# Patient Record
Sex: Female | Born: 1975 | Race: Black or African American | Hispanic: No | Marital: Single | Smoking: Current every day smoker
Health system: Southern US, Community
[De-identification: ages and names within clinical notes are randomized; demographics above are authoritative.]

## PROBLEM LIST (undated history)

## (undated) DIAGNOSIS — D3500 Benign neoplasm of unspecified adrenal gland: Secondary | ICD-10-CM

## (undated) DIAGNOSIS — D219 Benign neoplasm of connective and other soft tissue, unspecified: Secondary | ICD-10-CM

## (undated) DIAGNOSIS — N83209 Unspecified ovarian cyst, unspecified side: Secondary | ICD-10-CM

## (undated) DIAGNOSIS — I708 Atherosclerosis of other arteries: Secondary | ICD-10-CM

## (undated) HISTORY — PX: APPENDECTOMY: SHX54

## (undated) HISTORY — PX: CHOLECYSTECTOMY: SHX55

---

## 2009-07-20 ENCOUNTER — Emergency Department (HOSPITAL_COMMUNITY): Admission: EM | Admit: 2009-07-20 | Discharge: 2009-07-20 | Payer: Self-pay | Admitting: Emergency Medicine

## 2009-08-19 ENCOUNTER — Emergency Department (HOSPITAL_COMMUNITY): Admission: EM | Admit: 2009-08-19 | Discharge: 2009-08-20 | Payer: Self-pay | Admitting: Emergency Medicine

## 2009-08-21 ENCOUNTER — Inpatient Hospital Stay (HOSPITAL_COMMUNITY): Admission: EM | Admit: 2009-08-21 | Discharge: 2009-08-24 | Payer: Self-pay | Admitting: Emergency Medicine

## 2009-12-15 ENCOUNTER — Emergency Department (HOSPITAL_COMMUNITY): Admission: EM | Admit: 2009-12-15 | Discharge: 2009-12-16 | Payer: Self-pay | Admitting: Emergency Medicine

## 2010-05-24 ENCOUNTER — Emergency Department (INDEPENDENT_AMBULATORY_CARE_PROVIDER_SITE_OTHER): Payer: Self-pay

## 2010-05-24 ENCOUNTER — Emergency Department (HOSPITAL_BASED_OUTPATIENT_CLINIC_OR_DEPARTMENT_OTHER)
Admission: EM | Admit: 2010-05-24 | Discharge: 2010-05-24 | Disposition: A | Discharge status: Home / Self Care | Payer: Self-pay | Attending: Emergency Medicine | Admitting: Emergency Medicine

## 2010-05-24 DIAGNOSIS — F17200 Nicotine dependence, unspecified, uncomplicated: Secondary | ICD-10-CM | POA: Insufficient documentation

## 2010-05-24 DIAGNOSIS — R109 Unspecified abdominal pain: Secondary | ICD-10-CM

## 2010-05-24 DIAGNOSIS — R1013 Epigastric pain: Secondary | ICD-10-CM | POA: Insufficient documentation

## 2010-05-24 DIAGNOSIS — R110 Nausea: Secondary | ICD-10-CM

## 2010-05-24 DIAGNOSIS — I10 Essential (primary) hypertension: Secondary | ICD-10-CM | POA: Insufficient documentation

## 2010-05-24 LAB — URINALYSIS, ROUTINE W REFLEX MICROSCOPIC
Hgb urine dipstick: NEGATIVE
Protein, ur: NEGATIVE mg/dL
Specific Gravity, Urine: 1.02 (ref 1.005–1.030)
Urine Glucose, Fasting: NEGATIVE mg/dL
pH: 7.5 (ref 5.0–8.0)

## 2010-05-24 LAB — COMPREHENSIVE METABOLIC PANEL
Calcium: 8.7 mg/dL (ref 8.4–10.5)
Chloride: 111 mEq/L (ref 96–112)
GFR calc Af Amer: >60 mL/min (ref >60)
GFR calc non Af Amer: >60 mL/min (ref >60)
Glucose, Bld: 86 mg/dL (ref 70–99)
Potassium: 3.4 mEq/L — ABNORMAL LOW (ref 3.5–5.1)

## 2010-05-24 LAB — DIFFERENTIAL
Eosinophils Absolute: 0.1 10*3/uL (ref 0.0–0.7)
Eosinophils Relative: 2 % (ref 0–5)
Lymphocytes Relative: 30 % (ref 12–46)
Lymphs Abs: 2.3 10*3/uL (ref 0.7–4.0)
Neutro Abs: 4.2 10*3/uL (ref 1.7–7.7)

## 2010-05-24 LAB — CBC
HCT: 36.1 % (ref 36.0–46.0)
Hemoglobin: 12.2 g/dL (ref 12.0–15.0)
MCH: 30.7 pg (ref 26.0–34.0)
MCHC: 33.8 g/dL (ref 30.0–36.0)
MCV: 90.9 fL (ref 78.0–100.0)
RDW: 14.2 % (ref 11.5–15.5)

## 2010-05-24 LAB — PREGNANCY, URINE: Preg Test, Ur: NEGATIVE

## 2010-06-16 LAB — DIFFERENTIAL
Basophils Absolute: 0 10*3/uL (ref 0.0–0.1)
Basophils Relative: 0 % (ref 0–1)
Monocytes Absolute: 0.5 10*3/uL (ref 0.1–1.0)
Monocytes Relative: 7 % (ref 3–12)
Neutro Abs: 3.7 10*3/uL (ref 1.7–7.7)
Neutrophils Relative %: 55 % (ref 43–77)

## 2010-06-16 LAB — POCT PREGNANCY, URINE: Preg Test, Ur: NEGATIVE

## 2010-06-16 LAB — COMPREHENSIVE METABOLIC PANEL
ALT: 18 U/L (ref 0–35)
Albumin: 4 g/dL (ref 3.5–5.2)
BUN: 8 mg/dL (ref 6–23)
Calcium: 9.3 mg/dL (ref 8.4–10.5)
Potassium: 3.8 mEq/L (ref 3.5–5.1)
Sodium: 137 mEq/L (ref 135–145)
Total Bilirubin: 0.4 mg/dL (ref 0.3–1.2)

## 2010-06-16 LAB — CBC
HCT: 42.5 % (ref 36.0–46.0)
Hemoglobin: 14.6 g/dL (ref 12.0–15.0)
MCH: 31.5 pg (ref 26.0–34.0)
MCHC: 34.4 g/dL (ref 30.0–36.0)
Platelets: 221 10*3/uL (ref 150–400)
RBC: 4.63 MIL/uL (ref 3.87–5.11)
RDW: 13.6 % (ref 11.5–15.5)

## 2010-06-16 LAB — URINALYSIS, ROUTINE W REFLEX MICROSCOPIC
Bilirubin Urine: NEGATIVE
Glucose, UA: NEGATIVE mg/dL
Protein, ur: NEGATIVE mg/dL
Specific Gravity, Urine: 1.021 (ref 1.005–1.030)
Urobilinogen, UA: 1 mg/dL (ref 0.0–1.0)
pH: 7 (ref 5.0–8.0)

## 2010-06-20 LAB — COMPREHENSIVE METABOLIC PANEL
Albumin: 3.3 g/dL — ABNORMAL LOW (ref 3.5–5.2)
BUN: 6 mg/dL (ref 6–23)
Creatinine, Ser: 0.52 mg/dL (ref 0.4–1.2)
Total Protein: 6.5 g/dL (ref 6.0–8.3)

## 2010-06-20 LAB — HEPATIC FUNCTION PANEL
AST: 37 U/L (ref 0–37)
Albumin: 4.4 g/dL (ref 3.5–5.2)
Alkaline Phosphatase: 65 U/L (ref 39–117)
Total Protein: 8 g/dL (ref 6.0–8.3)

## 2010-06-20 LAB — CBC
HCT: 33.2 % — ABNORMAL LOW (ref 36.0–46.0)
HCT: 36.8 % (ref 36.0–46.0)
Hemoglobin: 11.6 g/dL — ABNORMAL LOW (ref 12.0–15.0)
Hemoglobin: 12.5 g/dL (ref 12.0–15.0)
Hemoglobin: 14.5 g/dL (ref 12.0–15.0)
MCHC: 34.7 g/dL (ref 30.0–36.0)
MCHC: 34.7 g/dL (ref 30.0–36.0)
MCV: 94.1 fL (ref 78.0–100.0)
MCV: 94.7 fL (ref 78.0–100.0)
Platelets: 205 10*3/uL (ref 150–400)
RBC: 4.44 MIL/uL (ref 3.87–5.11)
RBC: 4.61 MIL/uL (ref 3.87–5.11)
RDW: 13.4 % (ref 11.5–15.5)
RDW: 13.7 % (ref 11.5–15.5)
WBC: 11.7 10*3/uL — ABNORMAL HIGH (ref 4.0–10.5)
WBC: 16.2 10*3/uL — ABNORMAL HIGH (ref 4.0–10.5)
WBC: 21.9 10*3/uL — ABNORMAL HIGH (ref 4.0–10.5)

## 2010-06-20 LAB — POCT I-STAT, CHEM 8
BUN: 11 mg/dL (ref 6–23)
Creatinine, Ser: 0.6 mg/dL (ref 0.4–1.2)
Glucose, Bld: 121 mg/dL — ABNORMAL HIGH (ref 70–99)
Potassium: 4.2 mEq/L (ref 3.5–5.1)
Sodium: 140 mEq/L (ref 135–145)
TCO2: 23 mmol/L (ref 0–100)

## 2010-06-20 LAB — BASIC METABOLIC PANEL
BUN: 6 mg/dL (ref 6–23)
CO2: 21 mEq/L (ref 19–32)
Calcium: 9.6 mg/dL (ref 8.4–10.5)
Creatinine, Ser: 0.59 mg/dL (ref 0.4–1.2)
GFR calc Af Amer: >60 mL/min (ref >60)
GFR calc non Af Amer: >60 mL/min (ref >60)
GFR calc non Af Amer: >60 mL/min (ref >60)
Glucose, Bld: 74 mg/dL (ref 70–99)
Potassium: 3.6 mEq/L (ref 3.5–5.1)
Sodium: 135 mEq/L (ref 135–145)

## 2010-06-20 LAB — RPR: RPR Ser Ql: NONREACTIVE

## 2010-06-20 LAB — URINE MICROSCOPIC-ADD ON

## 2010-06-20 LAB — BLOOD GAS, ARTERIAL
FIO2: 0.21 %
O2 Saturation: 96.4 %
pCO2 arterial: 39.1 mmHg (ref 35.0–45.0)
pO2, Arterial: 83.6 mmHg (ref 80.0–100.0)

## 2010-06-20 LAB — CULTURE, BLOOD (ROUTINE X 2)

## 2010-06-20 LAB — DIFFERENTIAL
Basophils Relative: 0 % (ref 0–1)
Basophils Relative: 0 % (ref 0–1)
Eosinophils Absolute: 0.1 10*3/uL (ref 0.0–0.7)
Lymphocytes Relative: 6 % — ABNORMAL LOW (ref 12–46)
Lymphs Abs: 1.2 10*3/uL (ref 0.7–4.0)
Lymphs Abs: 1.4 10*3/uL (ref 0.7–4.0)
Monocytes Absolute: 0.3 10*3/uL (ref 0.1–1.0)
Monocytes Relative: 2 % — ABNORMAL LOW (ref 3–12)
Monocytes Relative: 5 % (ref 3–12)
Neutro Abs: 19.6 10*3/uL — ABNORMAL HIGH (ref 1.7–7.7)
Neutrophils Relative %: 85 % — ABNORMAL HIGH (ref 43–77)
Neutrophils Relative %: 89 % — ABNORMAL HIGH (ref 43–77)

## 2010-06-20 LAB — URINALYSIS, ROUTINE W REFLEX MICROSCOPIC
Bilirubin Urine: NEGATIVE
Glucose, UA: NEGATIVE mg/dL
Glucose, UA: NEGATIVE mg/dL
Hgb urine dipstick: NEGATIVE
Ketones, ur: NEGATIVE mg/dL
Leukocytes, UA: NEGATIVE
Protein, ur: NEGATIVE mg/dL
Urobilinogen, UA: 1 mg/dL (ref 0.0–1.0)
pH: 5.5 (ref 5.0–8.0)

## 2010-06-20 LAB — WET PREP, GENITAL
Trich, Wet Prep: NONE SEEN
Yeast Wet Prep HPF POC: NONE SEEN

## 2010-06-20 LAB — ANA: Anti Nuclear Antibody(ANA): NEGATIVE

## 2010-06-20 LAB — RAPID URINE DRUG SCREEN, HOSP PERFORMED
Barbiturates: NOT DETECTED
Cocaine: NOT DETECTED
Opiates: NOT DETECTED
Tetrahydrocannabinol: POSITIVE — AB

## 2010-06-20 LAB — C-REACTIVE PROTEIN: CRP: 7.6 mg/dL — ABNORMAL HIGH (ref <0.6)

## 2010-06-20 LAB — PORPHYRINS, FRACTIONATED URINE (TIMED COLLECTION): Volume, Urine-PORPF: 2500

## 2010-06-20 LAB — AMINOLEVULINIC ACID, 24 HOUR: Total Volume - ALA: 1146 mL

## 2010-06-20 LAB — POCT PREGNANCY, URINE: Preg Test, Ur: NEGATIVE

## 2010-06-20 LAB — MONONUCLEOSIS SCREEN: Mono Screen: NEGATIVE

## 2010-06-20 LAB — TROPONIN I: Troponin I: <0.01 ng/mL (ref 0.00–0.06)

## 2010-06-20 LAB — CK TOTAL AND CKMB (NOT AT ARMC): CK, MB: 4.1 ng/mL — ABNORMAL HIGH (ref 0.3–4.0)

## 2010-06-21 LAB — WET PREP, GENITAL: Yeast Wet Prep HPF POC: NONE SEEN

## 2010-06-21 LAB — URINALYSIS, ROUTINE W REFLEX MICROSCOPIC
Nitrite: NEGATIVE
Protein, ur: NEGATIVE mg/dL
Specific Gravity, Urine: 1.024 (ref 1.005–1.030)
Urobilinogen, UA: 1 mg/dL (ref 0.0–1.0)

## 2010-06-21 LAB — DIFFERENTIAL
Basophils Relative: 0 % (ref 0–1)
Eosinophils Absolute: 0.1 10*3/uL (ref 0.0–0.7)
Lymphs Abs: 1.4 10*3/uL (ref 0.7–4.0)
Monocytes Relative: 1 % — ABNORMAL LOW (ref 3–12)
Neutro Abs: 9.2 10*3/uL — ABNORMAL HIGH (ref 1.7–7.7)
Neutrophils Relative %: 85 % — ABNORMAL HIGH (ref 43–77)

## 2010-06-21 LAB — CBC
MCHC: 34.9 g/dL (ref 30.0–36.0)
MCV: 95.7 fL (ref 78.0–100.0)
Platelets: 226 10*3/uL (ref 150–400)
RBC: 3.93 MIL/uL (ref 3.87–5.11)
WBC: 10.9 10*3/uL — ABNORMAL HIGH (ref 4.0–10.5)

## 2010-06-21 LAB — BASIC METABOLIC PANEL
BUN: 9 mg/dL (ref 6–23)
Calcium: 9.2 mg/dL (ref 8.4–10.5)
Chloride: 108 mEq/L (ref 96–112)
Creatinine, Ser: 0.77 mg/dL (ref 0.4–1.2)
GFR calc Af Amer: >60 mL/min (ref >60)

## 2010-06-21 LAB — GC/CHLAMYDIA PROBE AMP, GENITAL
Chlamydia, DNA Probe: NEGATIVE
GC Probe Amp, Genital: NEGATIVE

## 2010-06-21 LAB — POCT PREGNANCY, URINE: Preg Test, Ur: NEGATIVE

## 2010-10-27 ENCOUNTER — Emergency Department (HOSPITAL_COMMUNITY)
Admission: EM | Admit: 2010-10-27 | Discharge: 2010-10-27 | Disposition: A | Discharge status: Home / Self Care | Payer: Self-pay | Attending: Emergency Medicine | Admitting: Emergency Medicine

## 2010-10-27 DIAGNOSIS — N898 Other specified noninflammatory disorders of vagina: Secondary | ICD-10-CM | POA: Insufficient documentation

## 2010-10-27 DIAGNOSIS — R109 Unspecified abdominal pain: Secondary | ICD-10-CM | POA: Insufficient documentation

## 2010-10-27 DIAGNOSIS — I10 Essential (primary) hypertension: Secondary | ICD-10-CM | POA: Insufficient documentation

## 2010-10-27 LAB — WET PREP, GENITAL
Trich, Wet Prep: NONE SEEN
Yeast Wet Prep HPF POC: NONE SEEN

## 2010-10-27 LAB — POCT I-STAT, CHEM 8
Calcium, Ion: 1.11 mmol/L — ABNORMAL LOW (ref 1.12–1.32)
Chloride: 108 mEq/L (ref 96–112)
Creatinine, Ser: 0.6 mg/dL (ref 0.50–1.10)
Glucose, Bld: 73 mg/dL (ref 70–99)
Potassium: 4.3 mEq/L (ref 3.5–5.1)

## 2010-10-27 LAB — URINALYSIS, ROUTINE W REFLEX MICROSCOPIC
Bilirubin Urine: NEGATIVE
Glucose, UA: NEGATIVE mg/dL
Ketones, ur: 15 mg/dL — AB
Specific Gravity, Urine: 1.025 (ref 1.005–1.030)
pH: 6.5 (ref 5.0–8.0)

## 2010-10-27 LAB — URINE MICROSCOPIC-ADD ON

## 2011-06-15 IMAGING — CT CT ABD-PELV W/ CM
2 of 5 series · 17 of 46 positions shown, 19 images · IV contrast (APPLIED)
Comparison: 07/20/2009.

CLINICAL DATA: Vomiting and nausea.  Constipation.  Periumbilical
pain.  Fever.

CT ABDOMEN AND PELVIS WITH CONTRAST
TECHNIQUE: Multidetector CT imaging of the abdomen and pelvis was
performed following the standard protocol during bolus
administration of intravenous contrast.
Contrast: 80 ml Omnipaque 300

[Series 2: abd/pelv with 5.0 b31f st · axial · 0.60mm/px · z∈[+758,+1138]mm · 14 of 86 slices shown, 16 images]
[im 5/86  soft-tissue]
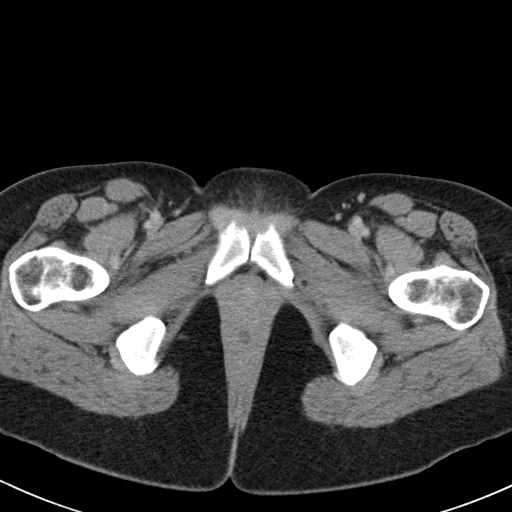
[im 5/86  bone]
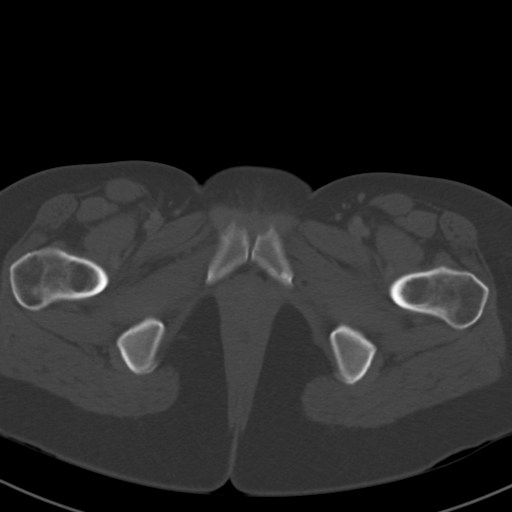
[im 13/86  soft-tissue]
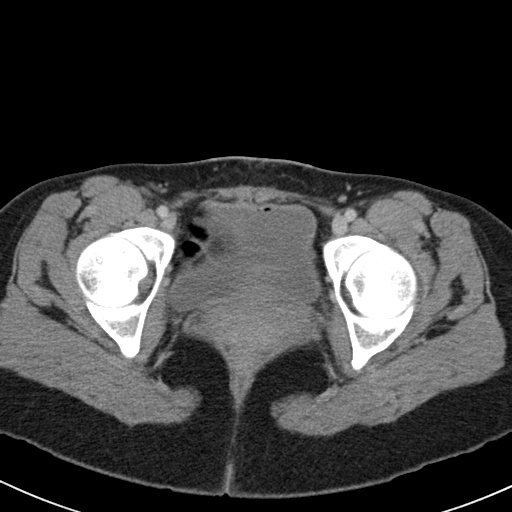
[im 18/86  soft-tissue]
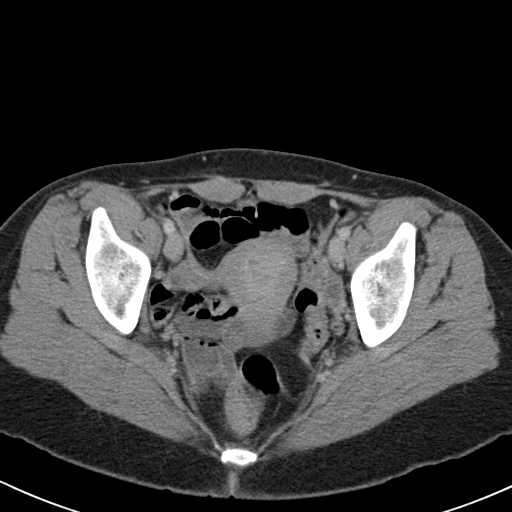
[im 22/86  soft-tissue]
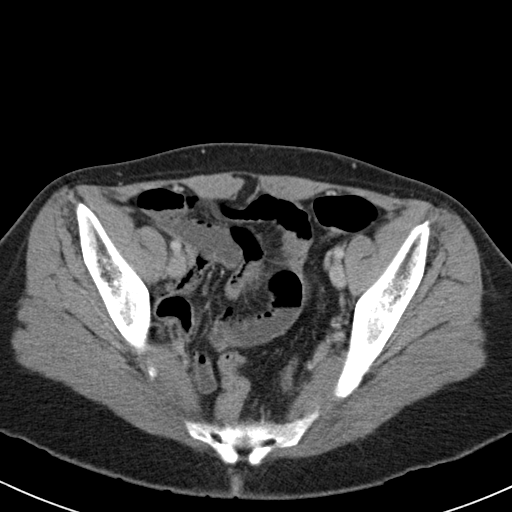
[im 30/86  soft-tissue]
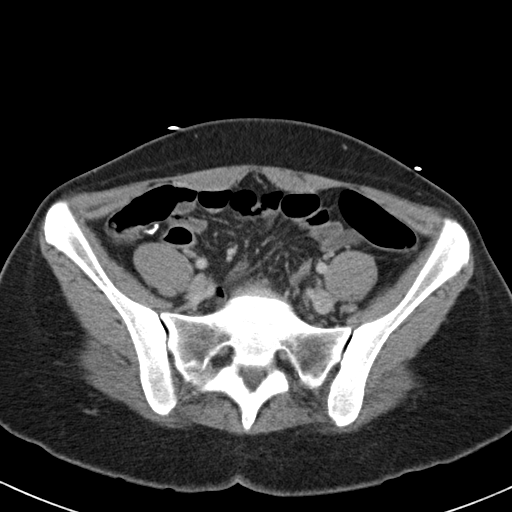
[im 35/86  soft-tissue]
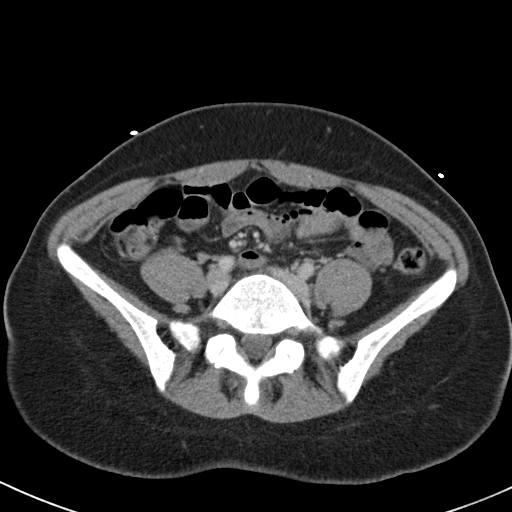
[im 39/86  soft-tissue]
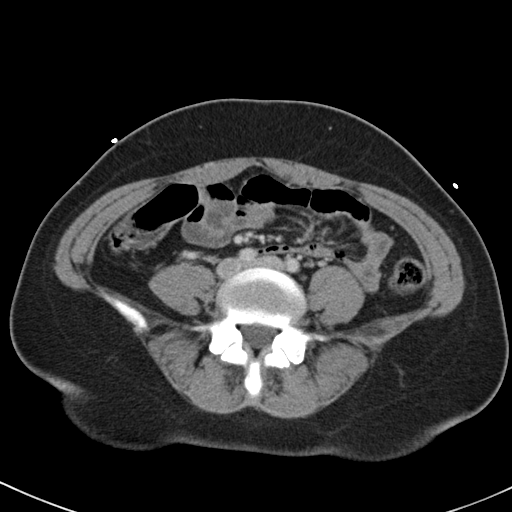
[im 47/86  soft-tissue]
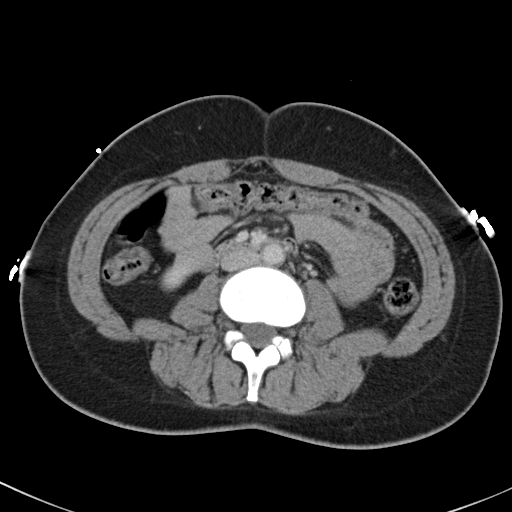
[im 52/86  soft-tissue]
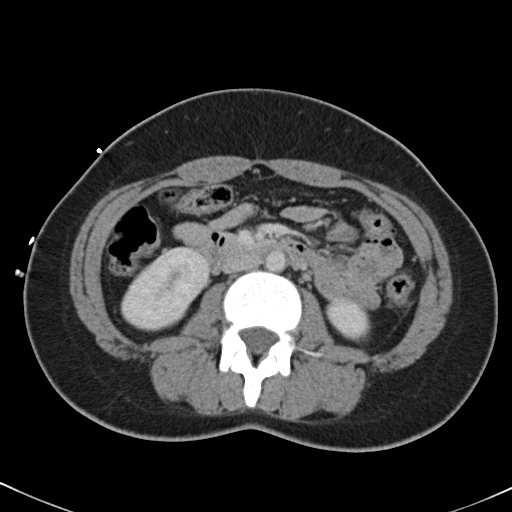
[im 52/86  bone]
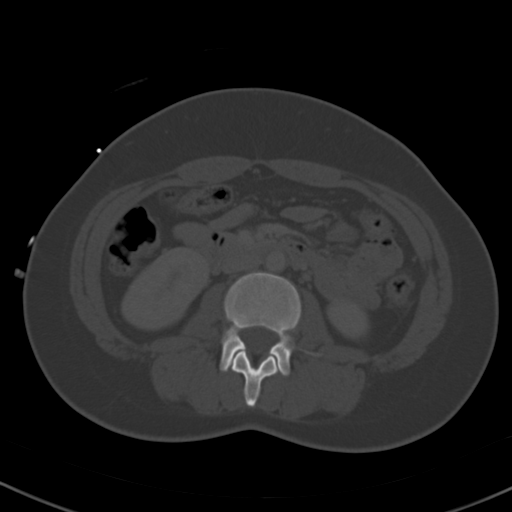
[im 56/86  soft-tissue]
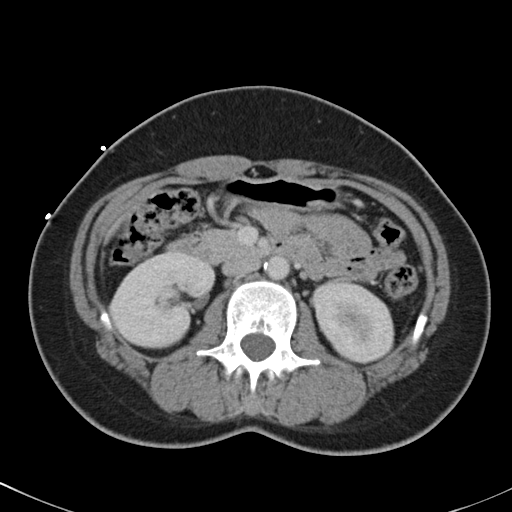
[im 64/86  soft-tissue]
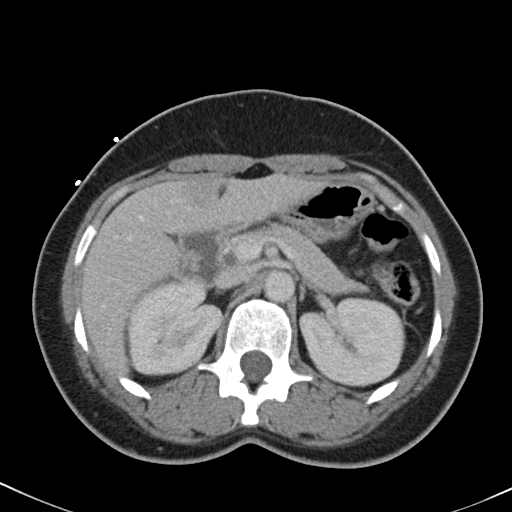
[im 69/86  soft-tissue]
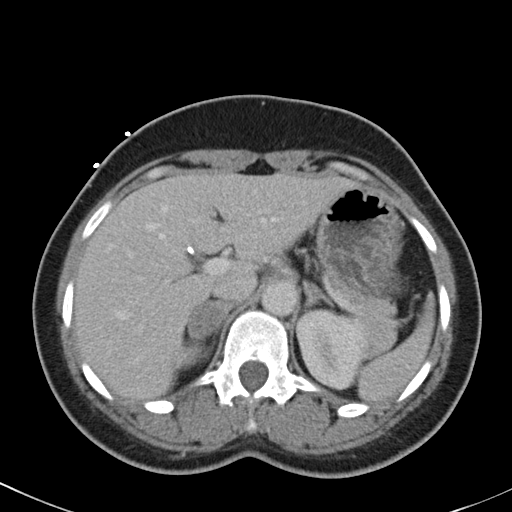
[im 73/86  soft-tissue]
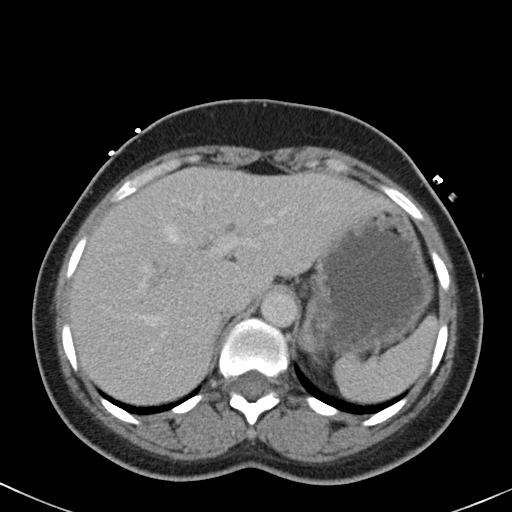
[im 81/86  soft-tissue]
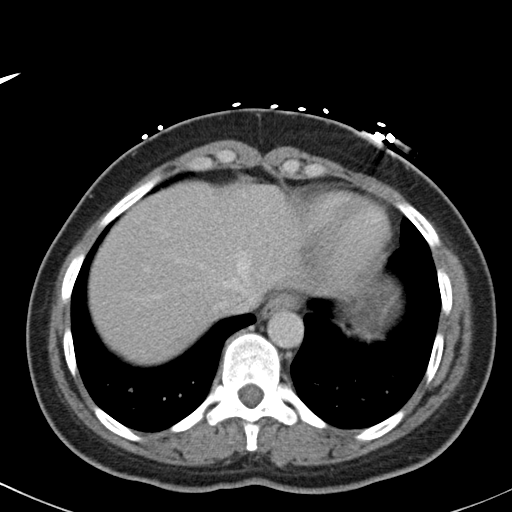

[Series 602: <mpr range> · coronal · 0.84mm/px · 3 of 91 slices shown]
[im 31/91  soft-tissue]
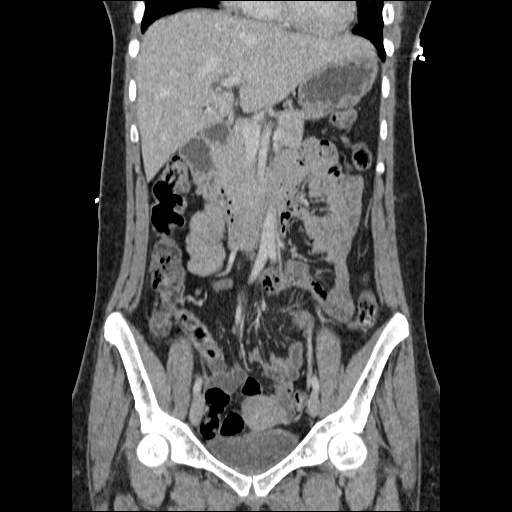
[im 41/91  soft-tissue]
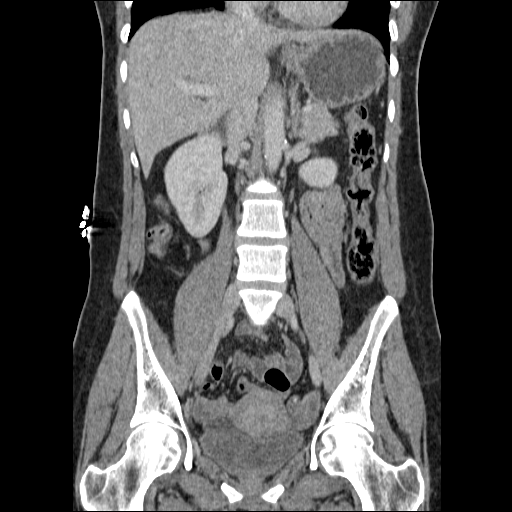
[im 51/91  soft-tissue]
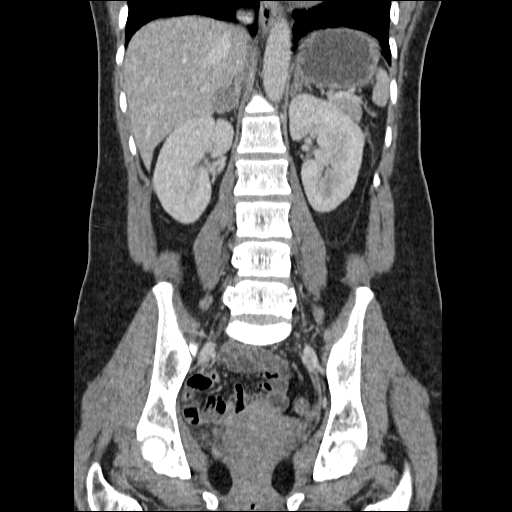

[17 of 46 positions shown; findings below may reference images not displayed]

FINDINGS: The lung bases are clear without focal nodule, mass, or
airspace disease.  The heart size is normal.  No significant
pleural or pericardial effusion is present.

The infused appearance of the liver and spleen is normal.  The
stomach, duodenum, and pancreas are within normal limits.  Mild
prominence of the common bile duct is within normal limits
following cholecystectomy.  A right adrenal adenoma is stable.  The
left adrenal gland is mildly thickened, but without focal mass.
The kidneys are within normal limits bilaterally.

The sigmoid colon is mostly collapsed.  The remainder of the colon
is within normal limits.  The distal small bowel is fluid-filled.
There are some air-fluid levels without significant distention of
bowel.  The appendix is surgically absent.  The uterus and adnexa
are normal for age.  No significant adenopathy or free fluid is
present.  Mild atherosclerotic calcifications are noted in the
aorta without aneurysm.

The bone windows are unremarkable.
IMPRESSION: 1.  Fluid levels within the distal small bowel without focal
dilation.  This may represent a nonspecific enteritis or ileus.
2.  Status post cholecystectomy and appendectomy.
3.  Stable right adrenal adenoma.

## 2011-08-18 ENCOUNTER — Emergency Department (HOSPITAL_COMMUNITY): Payer: Self-pay

## 2011-08-18 ENCOUNTER — Emergency Department (HOSPITAL_COMMUNITY)
Admission: EM | Admit: 2011-08-18 | Discharge: 2011-08-18 | Disposition: A | Discharge status: Home Health | Payer: Self-pay | Attending: Emergency Medicine | Admitting: Emergency Medicine

## 2011-08-18 ENCOUNTER — Encounter (HOSPITAL_COMMUNITY): Payer: Self-pay

## 2011-08-18 DIAGNOSIS — R112 Nausea with vomiting, unspecified: Secondary | ICD-10-CM | POA: Insufficient documentation

## 2011-08-18 DIAGNOSIS — I708 Atherosclerosis of other arteries: Secondary | ICD-10-CM

## 2011-08-18 DIAGNOSIS — R109 Unspecified abdominal pain: Secondary | ICD-10-CM | POA: Insufficient documentation

## 2011-08-18 DIAGNOSIS — I7090 Unspecified atherosclerosis: Secondary | ICD-10-CM

## 2011-08-18 DIAGNOSIS — R3915 Urgency of urination: Secondary | ICD-10-CM | POA: Insufficient documentation

## 2011-08-18 HISTORY — DX: Atherosclerosis of other arteries: I70.8

## 2011-08-18 HISTORY — DX: Benign neoplasm of unspecified adrenal gland: D35.00

## 2011-08-18 HISTORY — DX: Unspecified atherosclerosis: I70.90

## 2011-08-18 LAB — URINALYSIS, ROUTINE W REFLEX MICROSCOPIC
Nitrite: NEGATIVE
Protein, ur: NEGATIVE mg/dL
Specific Gravity, Urine: 1.025 (ref 1.005–1.030)
Urobilinogen, UA: 1 mg/dL (ref 0.0–1.0)

## 2011-08-18 LAB — POCT PREGNANCY, URINE: Preg Test, Ur: NEGATIVE

## 2011-08-18 LAB — URINE MICROSCOPIC-ADD ON

## 2011-08-18 MED ORDER — ONDANSETRON HCL 4 MG PO TABS: 4.0000 mg | ORAL_TABLET | Freq: Four times a day (QID) | ORAL | Status: AC

## 2011-08-18 MED ORDER — ONDANSETRON 4 MG PO TBDP
8.0000 mg | ORAL_TABLET | Freq: Once | ORAL | Status: AC
  Administered 2011-08-18: 8 mg via ORAL
  Filled 2011-08-18: qty 2

## 2011-08-18 MED ORDER — OXYCODONE-ACETAMINOPHEN 5-325 MG PO TABS: 1.0000 | ORAL_TABLET | Freq: Four times a day (QID) | ORAL | Status: AC | PRN

## 2011-08-18 MED ORDER — HYDROMORPHONE HCL PF 2 MG/ML IJ SOLN
2.0000 mg | Freq: Once | INTRAMUSCULAR | Status: AC
  Administered 2011-08-18: 2 mg via INTRAMUSCULAR
  Filled 2011-08-18: qty 1

## 2011-08-18 MED ORDER — OXYCODONE-ACETAMINOPHEN 5-325 MG PO TABS
1.0000 | ORAL_TABLET | Freq: Once | ORAL | Status: AC
  Administered 2011-08-18: 1 via ORAL
  Filled 2011-08-18: qty 1

## 2011-08-18 NOTE — ED Provider Notes (Signed)
History  This chart was scribed for Allison Melter, MD by Bennett Scrape. This patient was seen in room STRE7/STRE7 and the patient's care was started at 3:29PM.  CSN: [id number]  Arrival date & time 08/18/11  1410   First MD Initiated Contact with Patient 08/18/11 1529      Chief Complaint  Patient presents with  . Flank Pain    The history is provided by the patient. No language interpreter was used.    Allison Hebert is a 36 y.o. female who presents to the Emergency Department complaining of 3 days of transient right flank pain that radiates to the RUQ with associated nausea and emesis. She also c/o urinary urgency.  Pt states that she is here for evaluation today, because the pain has become constant today. The pain is improved with laying down and worsened with movement. She has not taken any medications to improve her pain. She denies fever, sore throat, diarrhea, hematuria and SOB as associated symptoms. She denies having a h/o kidney stones. She has no h/o chronic medical conditions. She is a current everyday smoker and alcohol user   Past Medical History  Diagnosis Date  . Adrenal adenoma     Unchanged 08/18/11  . Atherosclerosis of arteries 08/18/11    Past Surgical History  Procedure Date  . Cholecystectomy   . Appendectomy     History reviewed. No pertinent family history.  History  Substance Use Topics  . Smoking status: Current Everyday Smoker  . Smokeless tobacco: Not on file  . Alcohol Use: Yes    Review of Systems  A complete 10 system review of systems was obtained and all systems are negative except as noted in the HPI and PMH.   Allergies  Review of patient's allergies indicates no known allergies.  Home Medications   Current Outpatient Rx  Name Route Sig Dispense Refill  . ONDANSETRON HCL 4 MG PO TABS Oral Take 1 tablet (4 mg total) by mouth every 6 (six) hours. 12 tablet 0  . OXYCODONE-ACETAMINOPHEN 5-325 MG PO TABS Oral Take 1 tablet by  mouth every 6 (six) hours as needed for pain. 15 tablet 0    Triage Vitals: BP 146/93  Pulse 118  Temp(Src) 97.9 F (36.6 C) (Oral)  Resp 22  SpO2 100%  Physical Exam  Nursing note and vitals reviewed. Constitutional: She is oriented to person, place, and time. She appears well-developed and well-nourished.  HENT:  Head: Normocephalic and atraumatic.  Eyes: Conjunctivae and EOM are normal. Pupils are equal, round, and reactive to light.  Neck: Normal range of motion and phonation normal. Neck supple.  Cardiovascular: Normal rate, regular rhythm and intact distal pulses.   Pulmonary/Chest: Effort normal and breath sounds normal. She exhibits no tenderness.  Abdominal: Soft. She exhibits no distension. There is tenderness. There is guarding.       Tenderness and guarding in RUQ  Musculoskeletal: Normal range of motion.       Tender to light touch in right flank  Neurological: She is alert and oriented to person, place, and time. She has normal strength. She exhibits normal muscle tone.  Skin: Skin is warm and dry.  Psychiatric: She has a normal mood and affect. Her behavior is normal. Judgment and thought content normal.    ED Course  Procedures (including critical care time)  DIAGNOSTIC STUDIES: Oxygen Saturation is 100% on room air, normal by my interpretation.    COORDINATION OF CARE: 4:07PM-Discussed   Labs Reviewed  URINALYSIS, ROUTINE W REFLEX MICROSCOPIC - Abnormal; Notable for the following:    APPearance CLOUDY (*)    Ketones, ur 40 (*)    Leukocytes, UA SMALL (*)    All other components within normal limits  URINE MICROSCOPIC-ADD ON - Abnormal; Notable for the following:    Squamous Epithelial / LPF MANY (*)    Bacteria, UA FEW (*)    All other components within normal limits  URINE CULTURE  POCT PREGNANCY, URINE  LAB REPORT - SCANNED   Ct Abdomen Pelvis Wo Contrast  08/18/2011  *RADIOLOGY REPORT*  Clinical Data: Flank pain. Right lower quadrant abdominal  pain. Nausea and vomiting.  CT ABDOMEN AND PELVIS WITHOUT CONTRAST  Technique:  Multidetector CT imaging of the abdomen and pelvis was performed following the standard protocol without intravenous contrast.  Comparison: CT of the abdomen and pelvis 05/24/2010.  Findings:  Lung Bases: Unremarkable.  Abdomen/Pelvis:  There are no abnormal calcifications within the collecting system of either kidney, along the course of either ureter, or within the lumen of the urinary bladder.  No hydroureteronephrosis or perinephric stranding at this time.  The patient is status post cholecystectomy.  The unenhanced appearance of the liver, spleen, pancreas, left adrenal gland and bilateral kidneys is unremarkable.  There is a 2.8 x 1.9 cm low attenuation (2 HU) lesion in the right adrenal gland which is similar to the prior study, compatible with an adrenal adenoma.  Postoperative changes of appendectomy are noted.  There is a trace volume of free fluid in the cul-de-sac, presumably physiologic in this young female patient.  No larger volume of ascites or pneumoperitoneum is identified.  The uterus, bilateral ovaries and urinary bladder are unremarkable on this noncontrast CT examination.  No pathologic distension of bowel.  No definite pathologic adenopathy identified within the abdomen and pelvis on today's noncontrast CT examination. Extensive atherosclerosis is noted throughout the abdominal and pelvic vasculature, without definite aneurysm.  Musculoskeletal: There are no aggressive appearing lytic or blastic lesions noted in the visualized portions of the skeleton.  IMPRESSION: 1.  No evidence of abnormal urinary tract calcifications. 2.  Status post cholecystectomy and appendectomy. 3.  Small volume of free fluid in the cul-de-sac is presumably physiologic in this young female patient. 4.  Atherosclerosis.  Original Report Authenticated By: Florencia Reasons, M.D.     1. Abdominal pain       MDM  Nonspecific pain,  moderate, requiring further evaluation.    Sent to Holding area for CT A/P. Discussed with Ms. Neva Seat   I personally performed the services described in this documentation, which was scribed in my presence. The recorded information has been reviewed and considered.     Allison Melter, MD 08/20/11 [phone]

## 2011-08-18 NOTE — Discharge Instructions (Signed)

## 2011-08-18 NOTE — ED Provider Notes (Signed)
Patient to ED for 3 days of abdominal pain which was much worse today. She was originally seen by Dr. Effie Shy and placed in the CDU to wait for CT abd/pelv and labs.  Results for orders placed during the hospital encounter of 08/18/11  URINALYSIS, ROUTINE W REFLEX MICROSCOPIC      Component Value Range   Color, Urine YELLOW  YELLOW    APPearance CLOUDY (*) CLEAR    Specific Gravity, Urine 1.025  1.005 - 1.030    pH 6.0  5.0 - 8.0    Glucose, UA NEGATIVE  NEGATIVE (mg/dL)   Hgb urine dipstick NEGATIVE  NEGATIVE    Bilirubin Urine NEGATIVE  NEGATIVE    Ketones, ur 40 (*) NEGATIVE (mg/dL)   Protein, ur NEGATIVE  NEGATIVE (mg/dL)   Urobilinogen, UA 1.0  0.0 - 1.0 (mg/dL)   Nitrite NEGATIVE  NEGATIVE    Leukocytes, UA SMALL (*) NEGATIVE   POCT PREGNANCY, URINE      Component Value Range   Preg Test, Ur NEGATIVE  NEGATIVE   URINE MICROSCOPIC-ADD ON      Component Value Range   Squamous Epithelial / LPF MANY (*) RARE    WBC, UA 3-6  <3 (WBC/hpf)   Bacteria, UA FEW (*) RARE    Urine-Other MUCOUS PRESENT     Ct Abdomen Pelvis Wo Contrast  08/18/2011  *RADIOLOGY REPORT*  Clinical Data: Flank pain. Right lower quadrant abdominal pain. Nausea and vomiting.  CT ABDOMEN AND PELVIS WITHOUT CONTRAST  Technique:  Multidetector CT imaging of the abdomen and pelvis was performed following the standard protocol without intravenous contrast.  Comparison: CT of the abdomen and pelvis 05/24/2010.  Findings:  Lung Bases: Unremarkable.  Abdomen/Pelvis:  There are no abnormal calcifications within the collecting system of either kidney, along the course of either ureter, or within the lumen of the urinary bladder.  No hydroureteronephrosis or perinephric stranding at this time.  The patient is status post cholecystectomy.  The unenhanced appearance of the liver, spleen, pancreas, left adrenal gland and bilateral kidneys is unremarkable.  There is a 2.8 x 1.9 cm low attenuation (2 HU) lesion in the right adrenal  gland which is similar to the prior study, compatible with an adrenal adenoma.  Postoperative changes of appendectomy are noted.  There is a trace volume of free fluid in the cul-de-sac, presumably physiologic in this young female patient.  No larger volume of ascites or pneumoperitoneum is identified.  The uterus, bilateral ovaries and urinary bladder are unremarkable on this noncontrast CT examination.  No pathologic distension of bowel.  No definite pathologic adenopathy identified within the abdomen and pelvis on today's noncontrast CT examination. Extensive atherosclerosis is noted throughout the abdominal and pelvic vasculature, without definite aneurysm.  Musculoskeletal: There are no aggressive appearing lytic or blastic lesions noted in the visualized portions of the skeleton.  IMPRESSION: 1.  No evidence of abnormal urinary tract calcifications. 2.  Status post cholecystectomy and appendectomy. 3.  Small volume of free fluid in the cul-de-sac is presumably physiologic in this young female patient. 4.  Atherosclerosis.  Original Report Authenticated By: Florencia Reasons, M.D.      CT abd/pelv shows no abnormalities. Her urinalysis showed some bacteria but had many epithelial cells in it. I have re-evaluated patient and she is feeling much better. My plan is to discharge her with pain and nausea medication as well as a follow-up to GI as she does not have a PCP. Patient is comfortable and agreeable  with plan.  Pt has been advised of the symptoms that warrant their return to the ED. Patient has voiced understanding and has agreed to follow-up with the PCP or specialist.   Dorthula Matas, PA 08/18/11 [phone]

## 2011-08-18 NOTE — ED Notes (Signed)
Pt complains of back pain sts her kidney is hurting really bad.

## 2011-08-19 LAB — URINE CULTURE: Colony Count: 35000

## 2011-08-20 ENCOUNTER — Encounter (HOSPITAL_COMMUNITY): Payer: Self-pay | Admitting: Emergency Medicine

## 2011-08-20 DIAGNOSIS — D3500 Benign neoplasm of unspecified adrenal gland: Secondary | ICD-10-CM | POA: Insufficient documentation

## 2011-08-20 DIAGNOSIS — I708 Atherosclerosis of other arteries: Secondary | ICD-10-CM | POA: Insufficient documentation

## 2011-08-20 NOTE — ED Provider Notes (Signed)
Medical screening examination/treatment/procedure(s) were conducted as a shared visit with non-physician practitioner(s) and myself.  I personally evaluated the patient during the encounter  Flint Melter, MD 08/20/11 [phone]

## 2012-02-08 ENCOUNTER — Emergency Department (HOSPITAL_BASED_OUTPATIENT_CLINIC_OR_DEPARTMENT_OTHER)
Admission: EM | Admit: 2012-02-08 | Discharge: 2012-02-08 | Disposition: A | Discharge status: Home / Self Care | Payer: Self-pay | Attending: Emergency Medicine | Admitting: Emergency Medicine

## 2012-02-08 ENCOUNTER — Emergency Department (HOSPITAL_BASED_OUTPATIENT_CLINIC_OR_DEPARTMENT_OTHER): Payer: Self-pay

## 2012-02-08 ENCOUNTER — Encounter (HOSPITAL_BASED_OUTPATIENT_CLINIC_OR_DEPARTMENT_OTHER): Payer: Self-pay | Admitting: *Deleted

## 2012-02-08 DIAGNOSIS — D259 Leiomyoma of uterus, unspecified: Secondary | ICD-10-CM | POA: Insufficient documentation

## 2012-02-08 DIAGNOSIS — Z9089 Acquired absence of other organs: Secondary | ICD-10-CM | POA: Insufficient documentation

## 2012-02-08 DIAGNOSIS — R109 Unspecified abdominal pain: Secondary | ICD-10-CM | POA: Insufficient documentation

## 2012-02-08 DIAGNOSIS — Z3202 Encounter for pregnancy test, result negative: Secondary | ICD-10-CM | POA: Insufficient documentation

## 2012-02-08 DIAGNOSIS — F17200 Nicotine dependence, unspecified, uncomplicated: Secondary | ICD-10-CM | POA: Insufficient documentation

## 2012-02-08 LAB — CBC WITH DIFFERENTIAL/PLATELET
Basophils Absolute: 0 10*3/uL (ref 0.0–0.1)
Basophils Relative: 1 % (ref 0–1)
Eosinophils Absolute: 0.2 10*3/uL (ref 0.0–0.7)
Eosinophils Relative: 2 % (ref 0–5)
HCT: 39.1 % (ref 36.0–46.0)
Lymphocytes Relative: 18 % (ref 12–46)
MCHC: 34.3 g/dL (ref 30.0–36.0)
MCV: 94.4 fL (ref 78.0–100.0)
Monocytes Absolute: 0.8 10*3/uL (ref 0.1–1.0)
Platelets: 192 10*3/uL (ref 150–400)
RDW: 13.9 % (ref 11.5–15.5)
WBC: 8.2 10*3/uL (ref 4.0–10.5)

## 2012-02-08 LAB — URINALYSIS, ROUTINE W REFLEX MICROSCOPIC
Bilirubin Urine: NEGATIVE
Glucose, UA: NEGATIVE mg/dL
Ketones, ur: NEGATIVE mg/dL
Nitrite: NEGATIVE
Specific Gravity, Urine: 1.028 (ref 1.005–1.030)
pH: 5.5 (ref 5.0–8.0)

## 2012-02-08 LAB — URINE MICROSCOPIC-ADD ON

## 2012-02-08 MED ORDER — HYDROMORPHONE HCL PF 1 MG/ML IJ SOLN
1.0000 mg | Freq: Once | INTRAMUSCULAR | Status: AC
  Administered 2012-02-08: 1 mg via INTRAVENOUS
  Filled 2012-02-08: qty 1

## 2012-02-08 MED ORDER — SODIUM CHLORIDE 0.9 % IV SOLN: Freq: Once | INTRAVENOUS | Status: DC

## 2012-02-08 MED ORDER — SODIUM CHLORIDE 0.9 % IV BOLUS (SEPSIS): 1000.0000 mL | Freq: Once | INTRAVENOUS | Status: DC

## 2012-02-08 MED ORDER — HYDROCODONE-ACETAMINOPHEN 5-325 MG PO TABS: 1.0000 | ORAL_TABLET | ORAL | Status: AC | PRN

## 2012-02-08 MED ORDER — IOHEXOL 300 MG/ML  SOLN: 25.0000 mL | INTRAMUSCULAR | Status: DC

## 2012-02-08 MED ORDER — ONDANSETRON HCL 4 MG/2ML IJ SOLN
4.0000 mg | Freq: Once | INTRAMUSCULAR | Status: AC
  Administered 2012-02-08: 4 mg via INTRAVENOUS
  Filled 2012-02-08: qty 2

## 2012-02-08 NOTE — ED Notes (Signed)
Family at bedside. 

## 2012-02-08 NOTE — ED Notes (Signed)
Patient is resting comfortably. 

## 2012-02-08 NOTE — ED Notes (Signed)
Unsuccessful IV attempt in left arm x 3.

## 2012-02-08 NOTE — ED Notes (Signed)
Pt. Has had several staff member to attempt IV sites unsuccessfully.. Pt. Has a R wrist IV she dislodged and pulled out.

## 2012-02-08 NOTE — ED Notes (Signed)
Vital signs stable. 

## 2012-02-08 NOTE — ED Notes (Signed)
MD at bedside. 

## 2012-02-08 NOTE — ED Provider Notes (Signed)
History     CSN: [id number]  Arrival date & time 02/08/12  1412   First MD Initiated Contact with Patient 02/08/12 1436      Chief Complaint  Patient presents with  . Abdominal Pain    (Consider location/radiation/quality/duration/timing/severity/associated sxs/prior treatment) Patient is a 36 y.o. female presenting with abdominal pain. The history is provided by the patient. The history is limited by the condition of the patient (severe pain).  Abdominal Pain The primary symptoms of the illness include abdominal pain.  She has been having generalized abdominal pain for the last week. There's been nausea and vomiting. She's not had any fever or chills but has broken out in sweats. Nothing makes the pain better nothing makes it worse. Pain is severe and rated at 10/10. She denies difficulty urinating he denies constipation or diarrhea. She is status post tubal ligation.  Past Medical History  Diagnosis Date  . Adrenal adenoma     Unchanged 08/18/11  . Atherosclerosis of arteries 08/18/11    Past Surgical History  Procedure Date  . Cholecystectomy   . Appendectomy     No family history on file.  History  Substance Use Topics  . Smoking status: Current Every Day Smoker -- 0.5 packs/day    Types: Cigarettes  . Smokeless tobacco: Not on file  . Alcohol Use: Yes    OB History    Grav Para Term Preterm Abortions TAB SAB Ect Mult Living                  Review of Systems  Unable to perform ROS: Other  Gastrointestinal: Positive for abdominal pain.  She is in severe pain and does not answer most questions, therefore detailed review of systems is not able to be obtained.  Allergies  Review of patient's allergies indicates no known allergies.  Home Medications  No current outpatient prescriptions on file.  BP 155/113  Pulse 118  Temp 99 F (37.2 C) (Oral)  Resp 18  Wt 122 lb (55.339 kg)  SpO2 98%  LMP 01/30/2012  Physical Exam  Nursing note and vitals  reviewed. 36 year old female who is diaphoretic and writhing in pain. Vital signs are significant for tachycardia with heart rate of 118, and hypertension with blood pressure 155/113. Oxygen saturation is 98%, which is normal. Head is normocephalic and atraumatic. PERRLA, EOMI. Oropharynx is clear. Neck is nontender and supple without adenopathy or JVD. Back is nontender. Lungs are clear without rales, wheezes, or rhonchi. Chest is nontender. Heart has regular rate and rhythm without murmur. Abdominal exam is difficult because the patient writhing in pain. However, she seems to have reasonably well localized right CVA tenderness without left CVA tenderness. She tenses her abdominal muscles as she is writhing on the bed, but the abdomen seems to be soft and without significant tenderness. There no masses palpable. Extremities have no cyanosis or edema, full range of motion is present. Skin is warm and diaphoretic without rash. Neurologic: Mental status is normal, cranial nerves are intact, there are no motor or sensory deficits.   ED Course  Procedures (including critical care time)  Results for orders placed during the hospital encounter of 02/08/12  URINALYSIS, ROUTINE W REFLEX MICROSCOPIC      Component Value Range   Color, Urine YELLOW  YELLOW   APPearance CLOUDY (*) CLEAR   Specific Gravity, Urine 1.028  1.005 - 1.030   pH 5.5  5.0 - 8.0   Glucose, UA NEGATIVE  NEGATIVE mg/dL  Hgb urine dipstick NEGATIVE  NEGATIVE   Bilirubin Urine NEGATIVE  NEGATIVE   Ketones, ur NEGATIVE  NEGATIVE mg/dL   Protein, ur NEGATIVE  NEGATIVE mg/dL   Urobilinogen, UA 1.0  0.0 - 1.0 mg/dL   Nitrite NEGATIVE  NEGATIVE   Leukocytes, UA SMALL (*) NEGATIVE  PREGNANCY, URINE      Component Value Range   Preg Test, Ur NEGATIVE  NEGATIVE  URINE MICROSCOPIC-ADD ON      Component Value Range   Squamous Epithelial / LPF RARE  RARE   WBC, UA 0-2  <3 WBC/hpf   RBC / HPF 3-6  <3 RBC/hpf   Bacteria, UA FEW  (*) RARE   Urine-Other MUCOUS PRESENT    CBC WITH DIFFERENTIAL      Component Value Range   WBC 8.2  4.0 - 10.5 K/uL   RBC 4.14  3.87 - 5.11 MIL/uL   Hemoglobin 13.4  12.0 - 15.0 g/dL   HCT 40.9  81.1 - 91.4 %   MCV 94.4  78.0 - 100.0 fL   MCH 32.4  26.0 - 34.0 pg   MCHC 34.3  30.0 - 36.0 g/dL   RDW 78.2  95.6 - 21.3 %   Platelets 192  150 - 400 K/uL   Neutrophils Relative 70  43 - 77 %   Neutro Abs 5.7  1.7 - 7.7 K/uL   Lymphocytes Relative 18  12 - 46 %   Lymphs Abs 1.5  0.7 - 4.0 K/uL   Monocytes Relative 10  3 - 12 %   Monocytes Absolute 0.8  0.1 - 1.0 K/uL   Eosinophils Relative 2  0 - 5 %   Eosinophils Absolute 0.2  0.0 - 0.7 K/uL   Basophils Relative 1  0 - 1 %   Basophils Absolute 0.0  0.0 - 0.1 K/uL     No diagnosis found.    MDM  Pain which seems to be concentrated in the right flank. I reviewed her old records and prior CT scans and she had a CT scan done in May showing no evidence of nephrolithiasis. Pelvic ultrasound in 2010 showed an 11x11x13 mm uterine fibroid. Therefore, kidney stone is felt to be very unlikely. Imaging will be obtained and CT scan with contrast and she will be treated symptomatically with IV fluids, IV hydromorphone, IV and ondansetron.  She feels much better after IV fluids and IV analgesics and antiemetics. She is no longer diaphoretic. CT scan is pending. Case is signed out to Dr. Ignacia Palma.      Dione Booze, MD 02/09/12 [phone]

## 2012-02-08 NOTE — Progress Notes (Signed)
5:12 PM Pt has poor IV access, so CT of abdomen and pelvis ordered as non-contrast study.  She continues to complain of pain, so remedicated with Dilaudid and Zofran.  6:38 PM Lab tests and CT of abdomen without contrast are negative.  I reviewed these results with pt.  In talking to her about her pain, she related that about a year ago she was told she had uterine fibroids.  She gets her pain either before or after her periods.  Her last period ended on November 4.  She denies having a discharge.  I offered to do pelvic exam which she declined.  I advised her to have followup with a gynecologist.  She can go to [facility], where they can do her pelvic exam and ultrasound, and advise her about uterine fibroids.

## 2012-02-08 NOTE — ED Notes (Signed)
Abdominal pain x 1 week. Diaphoretic at triage. Dusky skin color. States she was told a year ago she had a fibroid tumor that she never had follow up for.

## 2012-08-14 ENCOUNTER — Emergency Department (HOSPITAL_BASED_OUTPATIENT_CLINIC_OR_DEPARTMENT_OTHER)
Admission: EM | Admit: 2012-08-14 | Discharge: 2012-08-14 | Disposition: A | Discharge status: Home / Self Care | Payer: Self-pay | Attending: Emergency Medicine | Admitting: Emergency Medicine

## 2012-08-14 ENCOUNTER — Encounter (HOSPITAL_BASED_OUTPATIENT_CLINIC_OR_DEPARTMENT_OTHER): Payer: Self-pay | Admitting: *Deleted

## 2012-08-14 DIAGNOSIS — Z862 Personal history of diseases of the blood and blood-forming organs and certain disorders involving the immune mechanism: Secondary | ICD-10-CM | POA: Insufficient documentation

## 2012-08-14 DIAGNOSIS — M549 Dorsalgia, unspecified: Secondary | ICD-10-CM

## 2012-08-14 DIAGNOSIS — Z3202 Encounter for pregnancy test, result negative: Secondary | ICD-10-CM | POA: Insufficient documentation

## 2012-08-14 DIAGNOSIS — M545 Low back pain, unspecified: Secondary | ICD-10-CM | POA: Insufficient documentation

## 2012-08-14 DIAGNOSIS — Z8639 Personal history of other endocrine, nutritional and metabolic disease: Secondary | ICD-10-CM | POA: Insufficient documentation

## 2012-08-14 DIAGNOSIS — Z8679 Personal history of other diseases of the circulatory system: Secondary | ICD-10-CM | POA: Insufficient documentation

## 2012-08-14 DIAGNOSIS — F17200 Nicotine dependence, unspecified, uncomplicated: Secondary | ICD-10-CM | POA: Insufficient documentation

## 2012-08-14 LAB — URINE MICROSCOPIC-ADD ON

## 2012-08-14 LAB — URINALYSIS, ROUTINE W REFLEX MICROSCOPIC
Ketones, ur: 15 mg/dL — AB
Specific Gravity, Urine: 1.038 — ABNORMAL HIGH (ref 1.005–1.030)
pH: 5.5 (ref 5.0–8.0)

## 2012-08-14 MED ORDER — LORAZEPAM 2 MG/ML IJ SOLN
2.0000 mg | Freq: Once | INTRAMUSCULAR | Status: DC
  Filled 2012-08-14: qty 1

## 2012-08-14 MED ORDER — LORAZEPAM 2 MG/ML IJ SOLN
2.0000 mg | Freq: Once | INTRAMUSCULAR | Status: AC
  Administered 2012-08-14: 2 mg via INTRAMUSCULAR

## 2012-08-14 MED ORDER — OXYCODONE-ACETAMINOPHEN 5-325 MG PO TABS: 2.0000 | ORAL_TABLET | ORAL | Status: DC | PRN

## 2012-08-14 MED ORDER — HYDROMORPHONE HCL PF 2 MG/ML IJ SOLN
2.0000 mg | Freq: Once | INTRAMUSCULAR | Status: AC
  Administered 2012-08-14: 2 mg via INTRAMUSCULAR
  Filled 2012-08-14: qty 1

## 2012-08-14 NOTE — ED Notes (Signed)
Pt continues thrust around in bed in prone position, pt able to follow directions and could lay still long enough to get vs

## 2012-08-14 NOTE — ED Notes (Signed)
MD at bedside. 

## 2012-08-14 NOTE — ED Provider Notes (Signed)
History     CSN: [id number]  Arrival date & time 08/14/12  4098   First MD Initiated Contact with Patient 08/14/12 1924      Chief Complaint  Patient presents with  . Back Pain    (Consider location/radiation/quality/duration/timing/severity/associated sxs/prior treatment) Patient is a 37 y.o. female presenting with back pain. The history is provided by the patient and a relative. No language interpreter was used.  Back Pain Location:  Generalized Quality:  Aching Pain severity:  Moderate Pain is:  Same all the time Onset quality:  Gradual Duration:  3 days Timing:  Constant Progression:  Worsening Relieved by:  Nothing Associated symptoms: no abdominal pain    Pt complains of menstrual cramps and back pain.   Pt is jerking and twisting in bed in a rythmic pattern.  Pt reports she has been doing this for 3 days Past Medical History  Diagnosis Date  . Adrenal adenoma     Unchanged 08/18/11  . Atherosclerosis of arteries 08/18/11    Past Surgical History  Procedure Laterality Date  . Cholecystectomy    . Appendectomy      History reviewed. No pertinent family history.  History  Substance Use Topics  . Smoking status: Current Every Day Smoker -- 0.50 packs/day    Types: Cigarettes  . Smokeless tobacco: Not on file  . Alcohol Use: Yes    OB History   Grav Para Term Preterm Abortions TAB SAB Ect Mult Living                  Review of Systems  Gastrointestinal: Negative for abdominal pain.  Musculoskeletal: Positive for back pain.  All other systems reviewed and are negative.    Allergies  Review of patient's allergies indicates no known allergies.  Home Medications  No current outpatient prescriptions on file.  BP 149/89  Pulse 119  Temp(Src) 98.5 F (36.9 C) (Oral)  Resp 16  Ht 5\' 8"  (1.727 m)  Wt 130 lb (58.968 kg)  BMI 19.77 kg/m2  SpO2 95%  LMP 08/14/2012  Physical Exam  Vitals reviewed. Constitutional: She appears well-developed and  well-nourished.  HENT:  Head: Normocephalic.  Eyes: Pupils are equal, round, and reactive to light.  Neck: Normal range of motion.  Cardiovascular: Normal rate.   Pulmonary/Chest: Effort normal.  Abdominal: Soft.  Musculoskeletal: Normal range of motion.  Pt moving back in multiple extremes. Jerking violently and rhythmically,   Neurological: She is alert.  Skin: Skin is warm.    ED Course  Procedures (including critical care time)  Labs Reviewed  URINALYSIS, ROUTINE W REFLEX MICROSCOPIC - Abnormal; Notable for the following:    Color, Urine BROWN (*)    APPearance TURBID (*)    Specific Gravity, Urine 1.038 (*)    Hgb urine dipstick LARGE (*)    Bilirubin Urine MODERATE (*)    Ketones, ur 15 (*)    Protein, ur 100 (*)    Leukocytes, UA TRACE (*)    All other components within normal limits  URINE MICROSCOPIC-ADD ON - Abnormal; Notable for the following:    Squamous Epithelial / LPF FEW (*)    Bacteria, UA FEW (*)    All other components within normal limits  URINE CULTURE  PREGNANCY, URINE   No results found.   1. Backache       MDM  Dr. Fredderick Phenix in to see.  Pt is alert and oriented.  Pt denies history of mental illness.  Family reports pt does  this jerking jumping when she is in pain.  Pt counseled by my self and Dr. Fredderick Phenix to stop the jerking.  Pt given ativan Im and then dilaudid.  Pt stopped jerking and is more comfortable.  Pt given rxfor percocet, I advised her to see her MD for evaluation.  I am not sure what to make of the bizarre movement or the fact that she has been doing this for 3 days.  I think that lone may be cause of some of her discomfort.   Pt is aware of what she is doing.         Elson Areas, PA-C 08/14/12 2135  Elson Areas, PA-C 08/14/12 2142

## 2012-08-14 NOTE — ED Notes (Signed)
Pt is know lying flat in the bed thrusting back and forth in pain, states that this is not her typical pain she gets after her periods, however she just recently stopped her cycle, pt remains alert and oriented

## 2012-08-14 NOTE — ED Notes (Signed)
Upon assesment of patient, pt was noted to be lying prone in bed, thrusting back and forth, states that her pain started 3 weeks ago to mid lumbar area, denies any radiation, denies any injury. Pt alert and oriented, neuro intact

## 2012-08-14 NOTE — ED Provider Notes (Signed)
Medical screening examination/treatment/procedure(s) were conducted as a shared visit with non-physician practitioner(s) and myself.  I personally evaluated the patient during the encounter Patient presents with low back pain and some lower abdominal cramping which she attributes to her menstrual cycle. On evaluation she's riding around in the bed intermittently arching her back. She has no neurologic deficits. She is alert and oriented to surroundings. She is bizarre behaving but does not appear psychotic. The friend who is with her states that she always acts like this when she's in pain. She improved with treatment in the ED and was discharged home  Rolan Bucco, MD 08/14/12 2303

## 2012-08-14 NOTE — ED Notes (Signed)
Pt reports lower back pain " on and off" x 2 weeks

## 2012-08-15 LAB — URINE CULTURE

## 2015-04-28 ENCOUNTER — Emergency Department (HOSPITAL_BASED_OUTPATIENT_CLINIC_OR_DEPARTMENT_OTHER)
Admission: EM | Admit: 2015-04-28 | Discharge: 2015-04-29 | Disposition: A | Discharge status: Home / Self Care | Payer: Self-pay | Attending: Emergency Medicine | Admitting: Emergency Medicine

## 2015-04-28 ENCOUNTER — Encounter (HOSPITAL_BASED_OUTPATIENT_CLINIC_OR_DEPARTMENT_OTHER): Payer: Self-pay

## 2015-04-28 DIAGNOSIS — R61 Generalized hyperhidrosis: Secondary | ICD-10-CM | POA: Insufficient documentation

## 2015-04-28 DIAGNOSIS — R000 Tachycardia, unspecified: Secondary | ICD-10-CM | POA: Insufficient documentation

## 2015-04-28 DIAGNOSIS — Z9089 Acquired absence of other organs: Secondary | ICD-10-CM | POA: Insufficient documentation

## 2015-04-28 DIAGNOSIS — F17210 Nicotine dependence, cigarettes, uncomplicated: Secondary | ICD-10-CM | POA: Insufficient documentation

## 2015-04-28 DIAGNOSIS — Z3202 Encounter for pregnancy test, result negative: Secondary | ICD-10-CM | POA: Insufficient documentation

## 2015-04-28 DIAGNOSIS — R109 Unspecified abdominal pain: Secondary | ICD-10-CM | POA: Insufficient documentation

## 2015-04-28 DIAGNOSIS — R112 Nausea with vomiting, unspecified: Secondary | ICD-10-CM | POA: Insufficient documentation

## 2015-04-28 DIAGNOSIS — Z8679 Personal history of other diseases of the circulatory system: Secondary | ICD-10-CM | POA: Insufficient documentation

## 2015-04-28 DIAGNOSIS — Z9049 Acquired absence of other specified parts of digestive tract: Secondary | ICD-10-CM | POA: Insufficient documentation

## 2015-04-28 DIAGNOSIS — Z86018 Personal history of other benign neoplasm: Secondary | ICD-10-CM | POA: Insufficient documentation

## 2015-04-28 DIAGNOSIS — Z8742 Personal history of other diseases of the female genital tract: Secondary | ICD-10-CM | POA: Insufficient documentation

## 2015-04-28 HISTORY — DX: Benign neoplasm of connective and other soft tissue, unspecified: D21.9

## 2015-04-28 HISTORY — DX: Unspecified ovarian cyst, unspecified side: N83.209

## 2015-04-28 LAB — URINE MICROSCOPIC-ADD ON

## 2015-04-28 LAB — CBC WITH DIFFERENTIAL/PLATELET
Basophils Absolute: 0 10*3/uL (ref 0.0–0.1)
Basophils Relative: 0 %
EOS PCT: 1 %
Eosinophils Absolute: 0.1 10*3/uL (ref 0.0–0.7)
HCT: 37.1 % (ref 36.0–46.0)
HEMOGLOBIN: 12.3 g/dL (ref 12.0–15.0)
LYMPHS ABS: 2.2 10*3/uL (ref 0.7–4.0)
LYMPHS PCT: 16 %
MCH: 26.3 pg (ref 26.0–34.0)
MCHC: 33.2 g/dL (ref 30.0–36.0)
MCV: 79.4 fL (ref 78.0–100.0)
MONOS PCT: 15 %
Monocytes Absolute: 2 10*3/uL — ABNORMAL HIGH (ref 0.1–1.0)
Neutro Abs: 9.6 10*3/uL — ABNORMAL HIGH (ref 1.7–7.7)
Neutrophils Relative %: 68 %
Platelets: 359 10*3/uL (ref 150–400)
RBC: 4.67 MIL/uL (ref 3.87–5.11)
RDW: 16.6 % — ABNORMAL HIGH (ref 11.5–15.5)
WBC: 13.9 10*3/uL — AB (ref 4.0–10.5)

## 2015-04-28 LAB — COMPREHENSIVE METABOLIC PANEL
ALK PHOS: 67 U/L (ref 38–126)
ALT: 13 U/L — AB (ref 14–54)
AST: 30 U/L (ref 15–41)
Albumin: 4.5 g/dL (ref 3.5–5.0)
Anion gap: 14 (ref 5–15)
BUN: 21 mg/dL — ABNORMAL HIGH (ref 6–20)
CALCIUM: 9.8 mg/dL (ref 8.9–10.3)
CO2: 21 mmol/L — ABNORMAL LOW (ref 22–32)
CREATININE: 0.84 mg/dL (ref 0.44–1.00)
Chloride: 102 mmol/L (ref 101–111)
Glucose, Bld: 125 mg/dL — ABNORMAL HIGH (ref 65–99)
Potassium: 3.6 mmol/L (ref 3.5–5.1)
Sodium: 137 mmol/L (ref 135–145)
TOTAL PROTEIN: 8.9 g/dL — AB (ref 6.5–8.1)
Total Bilirubin: 0.6 mg/dL (ref 0.3–1.2)

## 2015-04-28 LAB — URINALYSIS, ROUTINE W REFLEX MICROSCOPIC
GLUCOSE, UA: NEGATIVE mg/dL
Hgb urine dipstick: NEGATIVE
Ketones, ur: 15 mg/dL — AB
Leukocytes, UA: NEGATIVE
NITRITE: NEGATIVE
PH: 6 (ref 5.0–8.0)
Protein, ur: 100 mg/dL — AB
SPECIFIC GRAVITY, URINE: 1.03 (ref 1.005–1.030)

## 2015-04-28 LAB — LIPASE, BLOOD: LIPASE: 22 U/L (ref 11–51)

## 2015-04-28 LAB — PREGNANCY, URINE: Preg Test, Ur: NEGATIVE

## 2015-04-28 MED ORDER — ONDANSETRON HCL 4 MG PO TABS: 4.0000 mg | ORAL_TABLET | Freq: Four times a day (QID) | ORAL | Status: AC

## 2015-04-28 MED ORDER — SODIUM CHLORIDE 0.9 % IV BOLUS (SEPSIS)
1000.0000 mL | Freq: Once | INTRAVENOUS | Status: AC
  Administered 2015-04-28: 1000 mL via INTRAVENOUS

## 2015-04-28 MED ORDER — HALOPERIDOL LACTATE 5 MG/ML IJ SOLN
5.0000 mg | Freq: Once | INTRAMUSCULAR | Status: AC
  Administered 2015-04-28: 5 mg via INTRAVENOUS
  Filled 2015-04-28: qty 1

## 2015-04-28 MED ORDER — KETOROLAC TROMETHAMINE 30 MG/ML IJ SOLN
30.0000 mg | Freq: Once | INTRAMUSCULAR | Status: AC
  Administered 2015-04-28: 30 mg via INTRAVENOUS
  Filled 2015-04-28: qty 1

## 2015-04-28 NOTE — ED Provider Notes (Signed)
CSN: [id number]     Arrival date & time 04/28/15  2057 History   By signing my name below, I, Doran Stabler, attest that this documentation has been prepared under the direction and in the presence of Deno Etienne, DO. Electronically Signed: Doran Stabler, ED Scribe. 04/28/2015. 10:56 PM.   Chief Complaint  Patient presents with  . Abdominal Pain   The history is provided by the patient. No language interpreter was used.   HPI Comments: Allison Hebert is a 40 y.o. femalewith a PMHx of uterine fibroids and ovarian cyst who presents to the Emergency Department complaining of constant, ongoing, burning, diffuse abdominal pain that started with her menstrual period 4 days ago. Pt states her sx reoccur monthly and that this pain is similar to prior episodes.  Pt also reports vomiting. Pt denies any exacerbating or alleviating factors.  Pt denies any pelvic pain, vaginal discharge or any other associated sx at this time.   Past Medical History  Diagnosis Date  . Adrenal adenoma     Unchanged 08/18/11  . Atherosclerosis of arteries 08/18/11  . Ovarian cyst   . Fibroids    Past Surgical History  Procedure Laterality Date  . Cholecystectomy    . Appendectomy     No family history on file. Social History  Substance Use Topics  . Smoking status: Current Every Day Smoker -- 0.50 packs/day    Types: Cigarettes  . Smokeless tobacco: None  . Alcohol Use: Yes   OB History    No data available     Review of Systems  Constitutional: Negative for fever and chills.  HENT: Negative for congestion and rhinorrhea.   Eyes: Negative for redness and visual disturbance.  Respiratory: Negative for shortness of breath and wheezing.   Cardiovascular: Negative for chest pain and palpitations.  Gastrointestinal: Positive for nausea, vomiting and abdominal pain. Negative for diarrhea.  Genitourinary: Negative for dysuria, urgency and vaginal discharge.  Musculoskeletal: Negative for myalgias and  arthralgias.  Skin: Negative for pallor and wound.  Neurological: Negative for dizziness and headaches.   Allergies  Review of patient's allergies indicates no known allergies.  Home Medications   Prior to Admission medications   Medication Sig Start Date End Date Taking? Authorizing Provider  ondansetron (ZOFRAN) 4 MG tablet Take 1 tablet (4 mg total) by mouth every 6 (six) hours. 04/28/15   Deno Etienne, DO   BP 164/101 mmHg  Pulse 128  Temp(Src) 98.2 F (36.8 C) (Oral)  Resp 20  Ht 5\' 8"  (1.727 m)  Wt 130 lb (58.968 kg)  BMI 19.77 kg/m2  SpO2 99%  LMP 04/24/2015 Physical Exam  Constitutional: She is oriented to person, place, and time. She appears well-developed and well-nourished. No distress.  Diffusely sweating, rocking back and forth in the room but lays still spontaneously  HENT:  Head: Normocephalic and atraumatic.  Eyes: EOM are normal. Pupils are equal, round, and reactive to light.  Neck: Normal range of motion. Neck supple.  Cardiovascular: Regular rhythm.  Tachycardia present.  Exam reveals no gallop and no friction rub.   No murmur heard. Pulmonary/Chest: Effort normal. No respiratory distress. She has no wheezes. She has no rales.  Abdominal: Soft. She exhibits no distension. There is no tenderness.  No specific area of abdominal tenderness  Musculoskeletal: She exhibits no edema or tenderness.  Neurological: She is alert and oriented to person, place, and time.  Skin: Skin is warm and dry. She is not diaphoretic.  Psychiatric: She has a  normal mood and affect. Her behavior is normal.  Nursing note and vitals reviewed.   ED Course  Procedures  DIAGNOSTIC STUDIES: Oxygen Saturation is 99% on room air, normal  by my interpretation.    COORDINATION OF CARE: 10:48 PM Will order Urinalysis and pregnancy screening. Will give Haldol. Discussed treatment plan with pt at bedside and pt agreed to plan.   Labs Review Labs Reviewed  URINALYSIS, ROUTINE W REFLEX  MICROSCOPIC (NOT AT [facility]) - Abnormal; Notable for the following:    Color, Urine AMBER (*)    APPearance CLOUDY (*)    Bilirubin Urine MODERATE (*)    Ketones, ur 15 (*)    Protein, ur 100 (*)    All other components within normal limits  CBC WITH DIFFERENTIAL/PLATELET - Abnormal; Notable for the following:    WBC 13.9 (*)    RDW 16.6 (*)    Neutro Abs 9.6 (*)    Monocytes Absolute 2.0 (*)    All other components within normal limits  COMPREHENSIVE METABOLIC PANEL - Abnormal; Notable for the following:    CO2 21 (*)    Glucose, Bld 125 (*)    BUN 21 (*)    Total Protein 8.9 (*)    ALT 13 (*)    All other components within normal limits  URINE MICROSCOPIC-ADD ON - Abnormal; Notable for the following:    Squamous Epithelial / LPF 0-5 (*)    Bacteria, UA FEW (*)    Casts HYALINE CASTS (*)    All other components within normal limits  PREGNANCY, URINE  LIPASE, BLOOD    Imaging Review No results found. I have personally reviewed and evaluated these images and lab results as part of my medical decision-making.   EKG Interpretation None       EJ placement: 18 gauge IV placed in L EJ. Skin prepped with alcohol pads, L EJ identified with Valsalva. Cannulated with good return of dark, non-pulsatile blood. Tachyderm placed after easily flushed with NS.    MDM   Final diagnoses:  Abdominal pain, unspecified abdominal location   40 yo F with a chief complaints of abdominal pain. Patient has a history of chronic abdominal pain thought to be secondary to uterine fibroids. Patient has had multiple evaluations mostly at outside facilities. Patient feels that this pain is similar to her past. States that this happens every time she has a menstrual cycle.  No focal findings for her abdominal pain. Suspect some of this may be psychiatric in nature as patient is writhing on the bed. Will treat patient with Haldol, and toradol.   Patient feeling much better. Will d/c home.    I  personally performed the services described in this documentation, which was scribed in my presence. The recorded information has been reviewed and is accurate.   11:44 PM:  I have discussed the diagnosis/risks/treatment options with the patient and believe the pt to be eligible for discharge home to follow-up with PCP. We also discussed returning to the ED immediately if new or worsening sx occur. We discussed the sx which are most concerning (e.g., sudden worsening pain, fever, inability to tolerate by mouth) that necessitate immediate return. Medications administered to the patient during their visit and any new prescriptions provided to the patient are listed below.  Medications given during this visit Medications  haloperidol lactate (HALDOL) injection 5 mg (5 mg Intravenous Given 04/28/15 2255)  sodium chloride 0.9 % bolus 1,000 mL (1,000 mLs Intravenous New Bag/Given 04/28/15 2255)  ketorolac (TORADOL) 30 MG/ML injection 30 mg (30 mg Intravenous Given 04/28/15 2258)    New Prescriptions   ONDANSETRON (ZOFRAN) 4 MG TABLET    Take 1 tablet (4 mg total) by mouth every 6 (six) hours.    The patient appears reasonably screen and/or stabilized for discharge and I doubt any other medical condition or other [facility] requiring further screening, evaluation, or treatment in the ED at this time prior to discharge.     Deno Etienne, DO 04/28/15 2344

## 2015-04-28 NOTE — ED Notes (Addendum)
C/o abd pain x 4 days  w n/v,  Denies burning w urination, no vag dc,  Denies diarrhea but increased bm  Pt rocking over bed

## 2015-04-28 NOTE — Discharge Instructions (Signed)

## 2015-04-28 NOTE — ED Notes (Signed)
Pt states she is not able to [place] [facility] for ED WR-pt became angry when she was advised there is a wait and states she is leaving

## 2015-04-28 NOTE — ED Notes (Signed)
abd pain x 4 days-n/v-denies diarrhea-states has hx of ovarian cyst and fibroids-pain started with LMP

## 2015-04-29 NOTE — ED Notes (Signed)
Pt is lying still and resting

## 2018-04-21 ENCOUNTER — Encounter (HOSPITAL_BASED_OUTPATIENT_CLINIC_OR_DEPARTMENT_OTHER): Payer: Self-pay | Admitting: Emergency Medicine

## 2018-04-21 ENCOUNTER — Emergency Department (HOSPITAL_BASED_OUTPATIENT_CLINIC_OR_DEPARTMENT_OTHER)
Admission: EM | Admit: 2018-04-21 | Discharge: 2018-04-21 | Disposition: A | Discharge status: Home / Self Care | Payer: Medicaid - Out of State | Attending: Emergency Medicine | Admitting: Emergency Medicine

## 2018-04-21 ENCOUNTER — Other Ambulatory Visit: Payer: Self-pay

## 2018-04-21 ENCOUNTER — Emergency Department (HOSPITAL_BASED_OUTPATIENT_CLINIC_OR_DEPARTMENT_OTHER): Payer: Medicaid - Out of State

## 2018-04-21 DIAGNOSIS — F17210 Nicotine dependence, cigarettes, uncomplicated: Secondary | ICD-10-CM | POA: Diagnosis not present

## 2018-04-21 DIAGNOSIS — R531 Weakness: Secondary | ICD-10-CM | POA: Diagnosis present

## 2018-04-21 DIAGNOSIS — A599 Trichomoniasis, unspecified: Secondary | ICD-10-CM | POA: Diagnosis not present

## 2018-04-21 DIAGNOSIS — J40 Bronchitis, not specified as acute or chronic: Secondary | ICD-10-CM

## 2018-04-21 LAB — BASIC METABOLIC PANEL
Anion gap: 7 (ref 5–15)
BUN: 12 mg/dL (ref 6–20)
CO2: 26 mmol/L (ref 22–32)
Calcium: 9.1 mg/dL (ref 8.9–10.3)
Chloride: 102 mmol/L (ref 98–111)
Creatinine, Ser: 0.54 mg/dL (ref 0.44–1.00)
GFR calc Af Amer: >60 mL/min (ref >60)
GFR calc non Af Amer: >60 mL/min (ref >60)
Glucose, Bld: 94 mg/dL (ref 70–99)
Potassium: 3.7 mmol/L (ref 3.5–5.1)
Sodium: 135 mmol/L (ref 135–145)

## 2018-04-21 LAB — CBC WITH DIFFERENTIAL/PLATELET
Abs Immature Granulocytes: 0.02 10*3/uL (ref 0.00–0.07)
Basophils Absolute: 0.1 10*3/uL (ref 0.0–0.1)
Basophils Relative: 1 %
Eosinophils Absolute: 0.1 10*3/uL (ref 0.0–0.5)
Eosinophils Relative: 1 %
HCT: 40.3 % (ref 36.0–46.0)
Hemoglobin: 12.5 g/dL (ref 12.0–15.0)
Immature Granulocytes: 0 %
Lymphocytes Relative: 23 %
Lymphs Abs: 1.7 10*3/uL (ref 0.7–4.0)
MCH: 25.3 pg — ABNORMAL LOW (ref 26.0–34.0)
MCHC: 31 g/dL (ref 30.0–36.0)
MCV: 81.4 fL (ref 80.0–100.0)
Monocytes Absolute: 1.1 10*3/uL — ABNORMAL HIGH (ref 0.1–1.0)
Monocytes Relative: 16 %
Neutro Abs: 4.3 10*3/uL (ref 1.7–7.7)
Neutrophils Relative %: 59 %
Platelets: 248 10*3/uL (ref 150–400)
RBC: 4.95 MIL/uL (ref 3.87–5.11)
RDW: 18.6 % — ABNORMAL HIGH (ref 11.5–15.5)
WBC: 7.4 10*3/uL (ref 4.0–10.5)
nRBC: 0 % (ref 0.0–0.2)

## 2018-04-21 LAB — URINALYSIS, ROUTINE W REFLEX MICROSCOPIC
Glucose, UA: NEGATIVE mg/dL
Ketones, ur: 15 mg/dL — AB
Nitrite: NEGATIVE
Protein, ur: 30 mg/dL — AB
Specific Gravity, Urine: >1.03 — ABNORMAL HIGH (ref 1.005–1.030)
pH: 5.5 (ref 5.0–8.0)

## 2018-04-21 LAB — URINALYSIS, MICROSCOPIC (REFLEX)

## 2018-04-21 LAB — PREGNANCY, URINE: Preg Test, Ur: NEGATIVE

## 2018-04-21 MED ORDER — PROMETHAZINE HCL 25 MG/ML IJ SOLN
12.5000 mg | Freq: Once | INTRAMUSCULAR | Status: AC
  Administered 2018-04-21: 12.5 mg via INTRAVENOUS

## 2018-04-21 MED ORDER — SODIUM CHLORIDE 0.9 % IV BOLUS
1000.0000 mL | Freq: Once | INTRAVENOUS | Status: AC
  Administered 2018-04-21: 1000 mL via INTRAVENOUS

## 2018-04-21 MED ORDER — KETOROLAC TROMETHAMINE 15 MG/ML IJ SOLN
15.0000 mg | Freq: Once | INTRAMUSCULAR | Status: AC
  Administered 2018-04-21: 15 mg via INTRAVENOUS
  Filled 2018-04-21: qty 1

## 2018-04-21 MED ORDER — METRONIDAZOLE 500 MG PO TABS: 500.0000 mg | ORAL_TABLET | Freq: Two times a day (BID) | ORAL | 0 refills | Status: AC

## 2018-04-21 MED ORDER — PROMETHAZINE HCL 25 MG/ML IJ SOLN
INTRAMUSCULAR | Status: AC
  Filled 2018-04-21: qty 1

## 2018-04-21 MED ORDER — ALBUTEROL SULFATE HFA 108 (90 BASE) MCG/ACT IN AERS
2.0000 | INHALATION_SPRAY | Freq: Once | RESPIRATORY_TRACT | Status: AC
  Administered 2018-04-21: 2 via RESPIRATORY_TRACT
  Filled 2018-04-21: qty 6.7

## 2018-04-21 NOTE — ED Notes (Signed)
Patient transported to X-ray 

## 2018-04-21 NOTE — ED Notes (Signed)
Allison Hebert top sent down with UA.

## 2018-04-21 NOTE — ED Triage Notes (Signed)
Pt c/o generalized body aches and weakness. Pt reports history of fibroid tumors and that she has had 2 menstrual periods this month.

## 2018-04-21 NOTE — ED Notes (Addendum)
Pt c/o cough with sinus and chest congestion x 3 days. Pt states she is having generalized pain and weakness. Pt denies vomiting and diarrhea, but states she is having nausea. Pt denies chest pain and is actively coughing. Pt denies taking any medications today and states she has not been taking any medications to treat symptoms.

## 2018-04-21 NOTE — ED Provider Notes (Signed)
[facility] Provider Note   CSN: [id number] Arrival date & time: 04/21/18  1224     History   Chief Complaint Chief Complaint  Patient presents with  . Weakness    x 1 week  . Generalized Body Aches    x 1 week    HPI Allison Hebert is a 43 y.o. female.  HPI   44 year old female with cough and congestion for the past 2 days.  Feels generally weak.  Mild nausea.  No vomiting or diarrhea.  Cough occasionally productive for whitish sputum.  She does not feel short of breath though.  No chest pain.  No unusual leg pain or swelling.  Past Medical History:  Diagnosis Date  . Adrenal adenoma    Unchanged 08/18/11  . Atherosclerosis of arteries 08/18/11  . Fibroids   . Ovarian cyst     Patient Active Problem List   Diagnosis Date Noted  . Adrenal adenoma   . Atherosclerosis of arteries     Past Surgical History:  Procedure Laterality Date  . APPENDECTOMY    . CHOLECYSTECTOMY       OB History   No obstetric history on file.      Home Medications    Prior to Admission medications   Medication Sig Start Date End Date Taking? Authorizing Provider  ondansetron (ZOFRAN) 4 MG tablet Take 1 tablet (4 mg total) by mouth every 6 (six) hours. 04/28/15   Deno Etienne, DO    Family History No family history on file.  Social History Social History   Tobacco Use  . Smoking status: Current Every Day Smoker    Packs/day: 0.50    Types: Cigarettes  . Smokeless tobacco: Never Used  Substance Use Topics  . Alcohol use: Yes  . Drug use: Yes    Types: Marijuana     Allergies   Patient has no known allergies.   Review of Systems Review of Systems  All systems reviewed and negative, other than as noted in HPI.  Physical Exam Updated Vital Signs BP 104/79   Pulse (!) 112   Temp 98.4 F (36.9 C) (Oral)   Resp 14   Ht 5\' 8"  (1.727 m)   Wt 54.4 kg   LMP 04/03/2018   SpO2 98%   BMI 18.25 kg/m   Physical Exam Vitals signs  and nursing note reviewed.  Constitutional:      General: She is not in acute distress.    Appearance: She is well-developed.  HENT:     Head: Normocephalic and atraumatic.  Eyes:     General:        Right eye: No discharge.        Left eye: No discharge.     Conjunctiva/sclera: Conjunctivae normal.  Neck:     Musculoskeletal: Neck supple.  Cardiovascular:     Rate and Rhythm: Regular rhythm. Tachycardia present.     Heart sounds: Normal heart sounds. No murmur. No friction rub. No gallop.   Pulmonary:     Effort: Pulmonary effort is normal. No respiratory distress.     Breath sounds: Normal breath sounds.  Abdominal:     General: There is no distension.     Palpations: Abdomen is soft.     Tenderness: There is no abdominal tenderness.  Musculoskeletal:        General: No tenderness.     Comments: Lower extremities symmetric as compared to each other. No calf tenderness. Negative Homan's. No palpable cords.  Skin:    General: Skin is warm and dry.  Neurological:     Mental Status: She is alert.  Psychiatric:        Behavior: Behavior normal.        Thought Content: Thought content normal.      ED Treatments / Results  Labs (all labs ordered are listed, but only abnormal results are displayed) Labs Reviewed  URINE CULTURE - Abnormal; Notable for the following components:      Result Value   Culture <10,000 COLONIES/mL INSIGNIFICANT GROWTH (*)    All other components within normal limits  CBC WITH DIFFERENTIAL/PLATELET - Abnormal; Notable for the following components:   MCH 25.3 (*)    RDW 18.6 (*)    Monocytes Absolute 1.1 (*)    All other components within normal limits  URINALYSIS, ROUTINE W REFLEX MICROSCOPIC - Abnormal; Notable for the following components:   Color, Urine AMBER (*)    APPearance CLOUDY (*)    Specific Gravity, Urine >1.030 (*)    Hgb urine dipstick LARGE (*)    Bilirubin Urine SMALL (*)    Ketones, ur 15 (*)    Protein, ur 30 (*)     Leukocytes, UA TRACE (*)    All other components within normal limits  URINALYSIS, MICROSCOPIC (REFLEX) - Abnormal; Notable for the following components:   Bacteria, UA FEW (*)    Trichomonas, UA PRESENT (*)    All other components within normal limits  PREGNANCY, URINE  BASIC METABOLIC PANEL    EKG None  Radiology No results found.   Dg Chest 2 View  Result Date: 04/21/2018 CLINICAL DATA:  Shortness of breath and cough for 3 days EXAM: CHEST - 2 VIEW COMPARISON:  June 7 26 FINDINGS: The heart size and mediastinal contours are within normal limits. Both lungs are clear. The visualized skeletal structures are unremarkable. IMPRESSION: No active cardiopulmonary disease. Electronically Signed   By: Abelardo Diesel M.D.   On: 04/21/2018 13:51    Procedures Procedures (including critical care time)  Medications Ordered in ED Medications - No data to display   Initial Impression / Assessment and Plan / ED Course  I have reviewed the triage vital signs and the nursing notes.  Pertinent labs & imaging results that were available during my care of the patient were reviewed by me and considered in my medical decision making (see chart for details).     43 year old female with symptoms consistent with bronchitis.  Incidentally noted trichomonas on UA.  Flagyl.  Symptomatic treatment for URI.  Final Clinical Impressions(s) / ED Diagnoses   Final diagnoses:  Bronchitis  Trichomonosis    ED Discharge Orders         Ordered    metroNIDAZOLE (FLAGYL) 500 MG tablet  2 times daily     04/21/18 1524           Virgel Manifold, MD 04/29/18 [phone]

## 2018-04-23 LAB — URINE CULTURE: Culture: <10000 — AB
